# Patient Record
Sex: Female | Born: 2000 | Race: Black or African American | Hispanic: No | Marital: Single | Smoking: Never smoker
Health system: Southern US, Community
[De-identification: ages and names within clinical notes are randomized; demographics above are authoritative.]

---

## 2019-12-15 ENCOUNTER — Other Ambulatory Visit: Payer: Self-pay

## 2019-12-15 ENCOUNTER — Emergency Department (HOSPITAL_COMMUNITY)
Admission: EM | Admit: 2019-12-15 | Discharge: 2019-12-15 | Disposition: A | Discharge status: Home / Self Care | Payer: Medicaid Other | Attending: Emergency Medicine | Admitting: Emergency Medicine

## 2019-12-15 ENCOUNTER — Encounter (HOSPITAL_COMMUNITY): Payer: Self-pay | Admitting: Emergency Medicine

## 2019-12-15 DIAGNOSIS — K0889 Other specified disorders of teeth and supporting structures: Secondary | ICD-10-CM | POA: Diagnosis present

## 2019-12-15 MED ORDER — NAPROXEN 250 MG PO TABS
500.0000 mg | ORAL_TABLET | Freq: Once | ORAL | Status: AC
  Administered 2019-12-15: 500 mg via ORAL
  Filled 2019-12-15: qty 2

## 2019-12-15 MED ORDER — PENICILLIN V POTASSIUM 500 MG PO TABS: 500.0000 mg | ORAL_TABLET | Freq: Four times a day (QID) | ORAL | 0 refills | Status: AC

## 2019-12-15 MED ORDER — BENZOCAINE 20 % MT GEL: 1.0000 "application " | Freq: Four times a day (QID) | OROMUCOSAL | 0 refills | Status: DC | PRN

## 2019-12-15 MED ORDER — NAPROXEN 500 MG PO TABS
500.0000 mg | ORAL_TABLET | Freq: Two times a day (BID) | ORAL | 0 refills | Status: DC
Start: 2019-12-15 — End: 2020-10-08

## 2019-12-15 NOTE — ED Triage Notes (Signed)
C/o right side dental pain for several day seen by dentist with no treatment.

## 2019-12-15 NOTE — ED Notes (Signed)
Pt is having pain on the right side lower back tooth. Was seen at dentist and they did an xray. Last took tylenol around 1400 today.

## 2019-12-15 NOTE — Discharge Instructions (Addendum)
As discussed, I am sending you home with pain medication, an antibiotic, and numbing gel.  Use as prescribed.  Please follow-up with your dentist early this week for further evaluation.  Return to the ER for new or worsening symptoms.

## 2019-12-15 NOTE — ED Notes (Signed)
Patient verbalizes understanding of discharge instructions. Opportunity for questioning and answers were provided. Armband removed by staff, pt discharged from ED. Pt. ambulatory and discharged home.  

## 2019-12-15 NOTE — ED Provider Notes (Signed)
[facility] EMERGENCY DEPARTMENT Provider Note   CSN: [id number] Arrival date & time: 12/15/19  2024     History Chief Complaint  Patient presents with  . Dental Pain    Nancy Dunn is a 19 y.o. female with no significant past medical history who presents to the ED due to right-sided dental pain x4 days.  Patient was seen by the dentist on Friday in which an x-ray was performed which was negative for any acute abnormalities.  Patient has tried Tylenol, Advil, and Excedrin with moderate relief.  Patient notes dental pain is located on the right lower side and occasionally radiates to her right ear. Dental pain worse with chewing. Denies trismus, changes to phonation, difficulties breathing, fever, and chills. Admits to slight edema of her gum around the right posterior molar. Mother is at bedside and gave a portion of the history.  History obtained from patient, mother, and past medical records. No interpreter used during encounter.      History reviewed. No pertinent past medical history.  There are no problems to display for this patient.   History reviewed. No pertinent surgical history.   OB History   No obstetric history on file.     No family history on file.  Social History   Tobacco Use  . Smoking status: Never Smoker  . Smokeless tobacco: Never Used  Substance Use Topics  . Alcohol use: Never  . Drug use: Never    Home Medications Prior to Admission medications   Medication Sig Start Date End Date Taking? Authorizing Provider  benzocaine (HURRICAINE) 20 % GEL Use as directed 1 application in the mouth or throat 4 (four) times daily as needed. 12/15/19   Mannie Stabile, PA-C  naproxen (NAPROSYN) 500 MG tablet Take 1 tablet (500 mg total) by mouth 2 (two) times daily. 12/15/19   Mannie Stabile, PA-C  penicillin v potassium (VEETID) 500 MG tablet Take 1 tablet (500 mg total) by mouth 4 (four) times daily for 7 days. 12/15/19 12/22/19   Mannie Stabile, PA-C    Allergies    Patient has no known allergies.  Review of Systems   Review of Systems  Constitutional: Negative for chills and fever.  HENT: Positive for dental problem. Negative for facial swelling, trouble swallowing and voice change.     Physical Exam Updated Vital Signs BP (!) 138/99   Pulse 93   Temp 98.2 F (36.8 C) (Oral)   Resp 18   Ht 5\' 6"  (1.676 m)   Wt (!) 159.7 kg   SpO2 98%   BMI 56.81 kg/m   Physical Exam Vitals and nursing note reviewed.  Constitutional:      General: She is not in acute distress.    Appearance: She is not ill-appearing.  HENT:     Head: Normocephalic.     Right Ear: Tympanic membrane normal.     Left Ear: Tympanic membrane normal.     Mouth/Throat:     Comments: Good dentition throughout.  Tenderness to palpation over right lower posterior molar with mild gingivitis.  No abscess appreciated on exam.  No trismus.  Tongue in normal position without protrusion.  No tenderness below tongue. Eyes:     Pupils: Pupils are equal, round, and reactive to light.  Neck:     Comments: No meningismus. Cardiovascular:     Rate and Rhythm: Normal rate and regular rhythm.     Pulses: Normal pulses.     Heart sounds:  Normal heart sounds. No murmur. No friction rub. No gallop.   Pulmonary:     Effort: Pulmonary effort is normal.     Breath sounds: Normal breath sounds.  Abdominal:     General: Abdomen is flat. There is no distension.     Palpations: Abdomen is soft.     Tenderness: There is no abdominal tenderness. There is no guarding or rebound.  Musculoskeletal:     Cervical back: Neck supple.     Comments: Able to move all 4 extremities without difficulty.  Skin:    General: Skin is warm and dry.  Neurological:     General: No focal deficit present.     Mental Status: She is alert.     ED Results / Procedures / Treatments   Labs (all labs ordered are listed, but only abnormal results are displayed) Labs  Reviewed - No data to display  EKG None  Radiology No results found.  Procedures Procedures (including critical care time)  Medications Ordered in ED Medications  naproxen (NAPROSYN) tablet 500 mg (500 mg Oral Given 12/15/19 2111)    ED Course  I have reviewed the triage vital signs and the nursing notes.  Pertinent labs & imaging results that were available during my care of the patient were reviewed by me and considered in my medical decision making (see chart for details).  Clinical Course as of Dec 15 2131  Nancy Fetter Dec 15, 2019  2133 Pulse Rate: 93 [CA]    Clinical Course User Index [CA] Suzy Bouchard, PA-C   MDM Rules/Calculators/A&P                     19 year old female presents to the ED due to right lower dental pain x4 days.  Upon arrival, patient is afebrile with mild tachycardia at 112, but otherwise reassuring vitals.  Patient in no acute distress and non-ill-appearing.  Good dentition throughout.  Tenderness palpation over right lower posterior molar with mild gingivitis.  No abscess appreciated on exam.  No trismus.  No concern for Ludwigs or deep space infection. Patient given naproxen here in the ED. Will discharge patient with antibiotic, pain medication, and benzocaine for symptomatic relief.  Advised patient to call dentist tomorrow for further evaluation. Strict ED precautions discussed with patient. Patient states understanding and agrees to plan. Patient discharged home in no acute distress and stable vitals.  9:34 PM Reassessed patient at bedside to ensure normal HR. Patient's HR in 90s at bedside.   Final Clinical Impression(s) / ED Diagnoses Final diagnoses:  Pain, dental    Rx / DC Orders ED Discharge Orders         Ordered    naproxen (NAPROSYN) 500 MG tablet  2 times daily     12/15/19 2114    penicillin v potassium (VEETID) 500 MG tablet  4 times daily     12/15/19 2114    benzocaine (HURRICAINE) 20 % GEL  4 times daily PRN     12/15/19  2114           Karie Kirks 12/15/19 2134    Blanchie Dessert, MD 12/15/19 [phone]

## 2019-12-22 ENCOUNTER — Other Ambulatory Visit: Payer: Self-pay

## 2019-12-22 ENCOUNTER — Encounter (HOSPITAL_COMMUNITY): Payer: Self-pay | Admitting: *Deleted

## 2019-12-22 ENCOUNTER — Emergency Department (HOSPITAL_COMMUNITY)
Admission: EM | Admit: 2019-12-22 | Discharge: 2019-12-22 | Disposition: A | Discharge status: Home / Self Care | Payer: Medicaid Other | Attending: Emergency Medicine | Admitting: Emergency Medicine

## 2019-12-22 DIAGNOSIS — Z5321 Procedure and treatment not carried out due to patient leaving prior to being seen by health care provider: Secondary | ICD-10-CM | POA: Diagnosis not present

## 2019-12-22 DIAGNOSIS — N899 Noninflammatory disorder of vagina, unspecified: Secondary | ICD-10-CM | POA: Insufficient documentation

## 2019-12-22 LAB — CBC
HCT: 39.8 % (ref 36.0–46.0)
Hemoglobin: 12.3 g/dL (ref 12.0–15.0)
MCH: 27.9 pg (ref 26.0–34.0)
MCHC: 30.9 g/dL (ref 30.0–36.0)
MCV: 90.2 fL (ref 80.0–100.0)
Platelets: 449 10*3/uL — ABNORMAL HIGH (ref 150–400)
RBC: 4.41 MIL/uL (ref 3.87–5.11)
RDW: 13.9 % (ref 11.5–15.5)
WBC: 10.2 10*3/uL (ref 4.0–10.5)
nRBC: 0 % (ref 0.0–0.2)

## 2019-12-22 LAB — COMPREHENSIVE METABOLIC PANEL
ALT: 22 U/L (ref 0–44)
AST: 20 U/L (ref 15–41)
Albumin: 3.7 g/dL (ref 3.5–5.0)
Alkaline Phosphatase: 72 U/L (ref 38–126)
Anion gap: 9 (ref 5–15)
BUN: 9 mg/dL (ref 6–20)
CO2: 23 mmol/L (ref 22–32)
Calcium: 9.3 mg/dL (ref 8.9–10.3)
Chloride: 104 mmol/L (ref 98–111)
Creatinine, Ser: 0.81 mg/dL (ref 0.44–1.00)
GFR calc Af Amer: >60 mL/min (ref >60)
GFR calc non Af Amer: >60 mL/min (ref >60)
Glucose, Bld: 101 mg/dL — ABNORMAL HIGH (ref 70–99)
Potassium: 4.3 mmol/L (ref 3.5–5.1)
Sodium: 136 mmol/L (ref 135–145)
Total Bilirubin: 0.5 mg/dL (ref 0.3–1.2)
Total Protein: 7.6 g/dL (ref 6.5–8.1)

## 2019-12-22 LAB — URINALYSIS, ROUTINE W REFLEX MICROSCOPIC
Bilirubin Urine: NEGATIVE
Glucose, UA: NEGATIVE mg/dL
Hgb urine dipstick: NEGATIVE
Ketones, ur: NEGATIVE mg/dL
Nitrite: NEGATIVE
Protein, ur: NEGATIVE mg/dL
Specific Gravity, Urine: 1.02 (ref 1.005–1.030)
pH: 6 (ref 5.0–8.0)

## 2019-12-22 LAB — I-STAT BETA HCG BLOOD, ED (MC, WL, AP ONLY): I-stat hCG, quantitative: <5 m[IU]/mL (ref <5)

## 2019-12-22 LAB — LIPASE, BLOOD: Lipase: 23 U/L (ref 11–51)

## 2019-12-22 MED ORDER — SODIUM CHLORIDE 0.9% FLUSH: 3.0000 mL | Freq: Once | INTRAVENOUS | Status: DC

## 2019-12-22 NOTE — ED Triage Notes (Signed)
Pt c/o vaginal pain and swelling she just noticed earlier tonight she reorts that its not that bad but she decided to come tonight before it gets worse  lmp march 17-24

## 2019-12-22 NOTE — ED Notes (Signed)
Called x3 wit no response

## 2019-12-22 NOTE — ED Notes (Signed)
Called x1 for vitals recheck with no response 

## 2020-04-23 ENCOUNTER — Emergency Department (HOSPITAL_COMMUNITY)
Admission: EM | Admit: 2020-04-23 | Discharge: 2020-04-23 | Disposition: A | Discharge status: Home / Self Care | Payer: Medicaid Other | Attending: Emergency Medicine | Admitting: Emergency Medicine

## 2020-04-23 ENCOUNTER — Emergency Department (HOSPITAL_COMMUNITY): Payer: Medicaid Other

## 2020-04-23 ENCOUNTER — Other Ambulatory Visit: Payer: Self-pay

## 2020-04-23 ENCOUNTER — Encounter (HOSPITAL_COMMUNITY): Payer: Self-pay

## 2020-04-23 DIAGNOSIS — Z20822 Contact with and (suspected) exposure to covid-19: Secondary | ICD-10-CM | POA: Diagnosis not present

## 2020-04-23 DIAGNOSIS — R0602 Shortness of breath: Secondary | ICD-10-CM | POA: Diagnosis present

## 2020-04-23 DIAGNOSIS — R079 Chest pain, unspecified: Secondary | ICD-10-CM | POA: Diagnosis not present

## 2020-04-23 DIAGNOSIS — Z5321 Procedure and treatment not carried out due to patient leaving prior to being seen by health care provider: Secondary | ICD-10-CM | POA: Diagnosis not present

## 2020-04-23 DIAGNOSIS — J029 Acute pharyngitis, unspecified: Secondary | ICD-10-CM | POA: Diagnosis not present

## 2020-04-23 LAB — CBC
HCT: 46.3 % — ABNORMAL HIGH (ref 36.0–46.0)
Hemoglobin: 14.3 g/dL (ref 12.0–15.0)
MCH: 27.8 pg (ref 26.0–34.0)
MCHC: 30.9 g/dL (ref 30.0–36.0)
MCV: 90.1 fL (ref 80.0–100.0)
Platelets: 458 10*3/uL — ABNORMAL HIGH (ref 150–400)
RBC: 5.14 MIL/uL — ABNORMAL HIGH (ref 3.87–5.11)
RDW: 15.5 % (ref 11.5–15.5)
WBC: 20.5 10*3/uL — ABNORMAL HIGH (ref 4.0–10.5)
nRBC: 0 % (ref 0.0–0.2)

## 2020-04-23 LAB — BASIC METABOLIC PANEL
Anion gap: 9 (ref 5–15)
BUN: 9 mg/dL (ref 6–20)
CO2: 25 mmol/L (ref 22–32)
Calcium: 9.9 mg/dL (ref 8.9–10.3)
Chloride: 102 mmol/L (ref 98–111)
Creatinine, Ser: 0.88 mg/dL (ref 0.44–1.00)
GFR calc Af Amer: >60 mL/min (ref >60)
GFR calc non Af Amer: >60 mL/min (ref >60)
Glucose, Bld: 153 mg/dL — ABNORMAL HIGH (ref 70–99)
Potassium: 5.7 mmol/L — ABNORMAL HIGH (ref 3.5–5.1)
Sodium: 136 mmol/L (ref 135–145)

## 2020-04-23 LAB — SARS CORONAVIRUS 2 BY RT PCR (HOSPITAL ORDER, PERFORMED IN ~~LOC~~ HOSPITAL LAB): SARS Coronavirus 2: NEGATIVE

## 2020-04-23 LAB — I-STAT BETA HCG BLOOD, ED (MC, WL, AP ONLY): I-stat hCG, quantitative: <5 m[IU]/mL (ref <5)

## 2020-04-23 LAB — TROPONIN I (HIGH SENSITIVITY): Troponin I (High Sensitivity): 3 ng/L (ref <18)

## 2020-04-23 NOTE — ED Triage Notes (Signed)
Pt reports that since Sun she has had SOB, CP, sore throat, pt is unvaccinated

## 2020-10-08 ENCOUNTER — Ambulatory Visit (INDEPENDENT_AMBULATORY_CARE_PROVIDER_SITE_OTHER): Payer: Medicaid Other

## 2020-10-08 ENCOUNTER — Ambulatory Visit (HOSPITAL_COMMUNITY)
Admission: EM | Admit: 2020-10-08 | Discharge: 2020-10-08 | Disposition: A | Discharge status: Home / Self Care | Payer: Medicaid Other | Attending: Emergency Medicine | Admitting: Emergency Medicine

## 2020-10-08 ENCOUNTER — Encounter (HOSPITAL_COMMUNITY): Payer: Self-pay | Admitting: Emergency Medicine

## 2020-10-08 ENCOUNTER — Other Ambulatory Visit: Payer: Self-pay

## 2020-10-08 DIAGNOSIS — M79671 Pain in right foot: Secondary | ICD-10-CM

## 2020-10-08 DIAGNOSIS — R2242 Localized swelling, mass and lump, left lower limb: Secondary | ICD-10-CM

## 2020-10-08 DIAGNOSIS — M79672 Pain in left foot: Secondary | ICD-10-CM | POA: Diagnosis not present

## 2020-10-08 MED ORDER — CYCLOBENZAPRINE HCL 5 MG PO TABS
10.0000 mg | ORAL_TABLET | Freq: Every day | ORAL | 0 refills | Status: DC
Start: 2020-10-08 — End: 2020-10-08

## 2020-10-08 MED ORDER — IBUPROFEN 800 MG PO TABS: 800.0000 mg | ORAL_TABLET | Freq: Three times a day (TID) | ORAL | 0 refills | Status: AC

## 2020-10-08 MED ORDER — CYCLOBENZAPRINE HCL 5 MG PO TABS: 5.0000 mg | ORAL_TABLET | Freq: Every day | ORAL | 0 refills | Status: AC

## 2020-10-08 NOTE — Discharge Instructions (Addendum)
Take ibuprofen 800 mg three times a day with food to help with pain  Take 5 mg muscle relaxer at bedtime as needed  Can you heat or ice for 15 minute intervals as needed for additional comfort  Can return to urgent care if pain worsens, increased swelling, increased numbness and symptoms persist

## 2020-10-08 NOTE — ED Provider Notes (Signed)
[facility]    CSN: [id number] Arrival date & time: 10/08/20  1733      History   Chief Complaint Chief Complaint  Patient presents with  . Fall  . Foot Injury    left    HPI Nancy Dunn is a 20 y.o. female.   Patient presents with left foot pain 10/10 and swelling after falling down stairs this morning. Unsure of landing. Numbness in 2nd-5th toe. ROM intact but painful. Painful to bear weight. Denies prior injury. Has not attempted medication.   History reviewed. No pertinent past medical history.  There are no problems to display for this patient.   History reviewed. No pertinent surgical history.  OB History   No obstetric history on file.      Home Medications    Prior to Admission medications   Medication Sig Start Date End Date Taking? Authorizing Provider  benzocaine (HURRICAINE) 20 % GEL Use as directed 1 application in the mouth or throat 4 (four) times daily as needed. 12/15/19   Mannie Stabile, PA-C  naproxen (NAPROSYN) 500 MG tablet Take 1 tablet (500 mg total) by mouth 2 (two) times daily. 12/15/19   Mannie Stabile, PA-C    Family History Family History  Problem Relation Age of Onset  . Healthy Mother   . Diabetes Father   . Hypertension Father     Social History Social History   Tobacco Use  . Smoking status: Never Smoker  . Smokeless tobacco: Never Used  Vaping Use  . Vaping Use: Never used  Substance Use Topics  . Alcohol use: Not Currently  . Drug use: Never     Allergies   Patient has no known allergies.   Review of Systems Review of Systems  Constitutional: Negative.   Respiratory: Negative.   Cardiovascular: Negative.   Musculoskeletal: Negative.   Skin: Negative.   Neurological: Positive for numbness. Negative for dizziness, tremors, seizures, syncope, facial asymmetry, speech difficulty, weakness, light-headedness and headaches.     Physical Exam Triage Vital Signs ED Triage Vitals  Enc Vitals  Group     BP 10/08/20 1747 (!) 143/101     Pulse Rate 10/08/20 1747 (!) 122     Resp 10/08/20 1747 20     Temp 10/08/20 1747 97.8 F (36.6 C)     Temp Source 10/08/20 1747 Tympanic     SpO2 10/08/20 1747 100 %     Weight 10/08/20 1744 (!) 363 lb (164.7 kg)     Height 10/08/20 1744 5\' 6"  (1.676 m)     Head Circumference --      Peak Flow --      Pain Score 10/08/20 1744 10     Pain Loc --      Pain Edu? --      Excl. in GC? --    No data found.  Updated Vital Signs BP (!) 143/101 (BP Location: Right Arm)   Pulse (!) 122   Temp 97.8 F (36.6 C) (Tympanic)   Resp 20   Ht 5\' 6"  (1.676 m)   Wt (!) 363 lb (164.7 kg)   LMP 09/19/2020   SpO2 100%   BMI 58.59 kg/m   Visual Acuity Right Eye Distance:   Left Eye Distance:   Bilateral Distance:    Right Eye Near:   Left Eye Near:    Bilateral Near:     Physical Exam Constitutional:      Appearance: Normal appearance. She is obese.  HENT:     Head: Normocephalic.  Eyes:     Extraocular Movements: Extraocular movements intact.  Pulmonary:     Effort: Pulmonary effort is normal.  Musculoskeletal:     Cervical back: Normal range of motion.     Comments: Swelling extending over midfoot into toes, tenderness over midfoot, ROM intact but painful   Skin:    General: Skin is warm and dry.  Neurological:     Mental Status: She is alert and oriented to person, place, and time. Mental status is at baseline.  Psychiatric:        Mood and Affect: Mood normal.        Behavior: Behavior normal.        Thought Content: Thought content normal.        Judgment: Judgment normal.      UC Treatments / Results  Labs (all labs ordered are listed, but only abnormal results are displayed) Labs Reviewed - No data to display  EKG   Radiology No results found.  Procedures Procedures (including critical care time)  Medications Ordered in UC Medications - No data to display  Initial Impression / Assessment and Plan / UC Course   I have reviewed the triage vital signs and the nursing notes.  Pertinent labs & imaging results that were available during my care of the patient were reviewed by me and considered in my medical decision making (see chart for details).  Right foot pain  1. X-ray right foot- negative 2. Ibuprofen 800 mg tid  3. Flexeril 5 mg at bedtime as needed 4. Cold or heat in 15 minute intervals as needed 5. Follow precautions discussed with patient. Verbalized understanding  Final Clinical Impressions(s) / UC Diagnoses   Final diagnoses:  None   Discharge Instructions   None    ED Prescriptions    None     PDMP not reviewed this encounter.   Valinda Hoar, NP 10/08/20 [phone]

## 2020-10-08 NOTE — ED Triage Notes (Signed)
Pt presents today with c/o of left foot pain. She reports falling this a.m. causing pain/swelling. +Ambulatory.

## 2022-03-18 IMAGING — DX DG FOOT COMPLETE 3+V*L*
3 series · 3 of 3 positions shown · non-contrast
Comparison: None.

CLINICAL DATA: Pt presents today with c/o of left foot pain. She
reports falling this a.m. causing pain/swelling.

EXAM:
LEFT FOOT - COMPLETE 3+ VIEW

[foot ap]
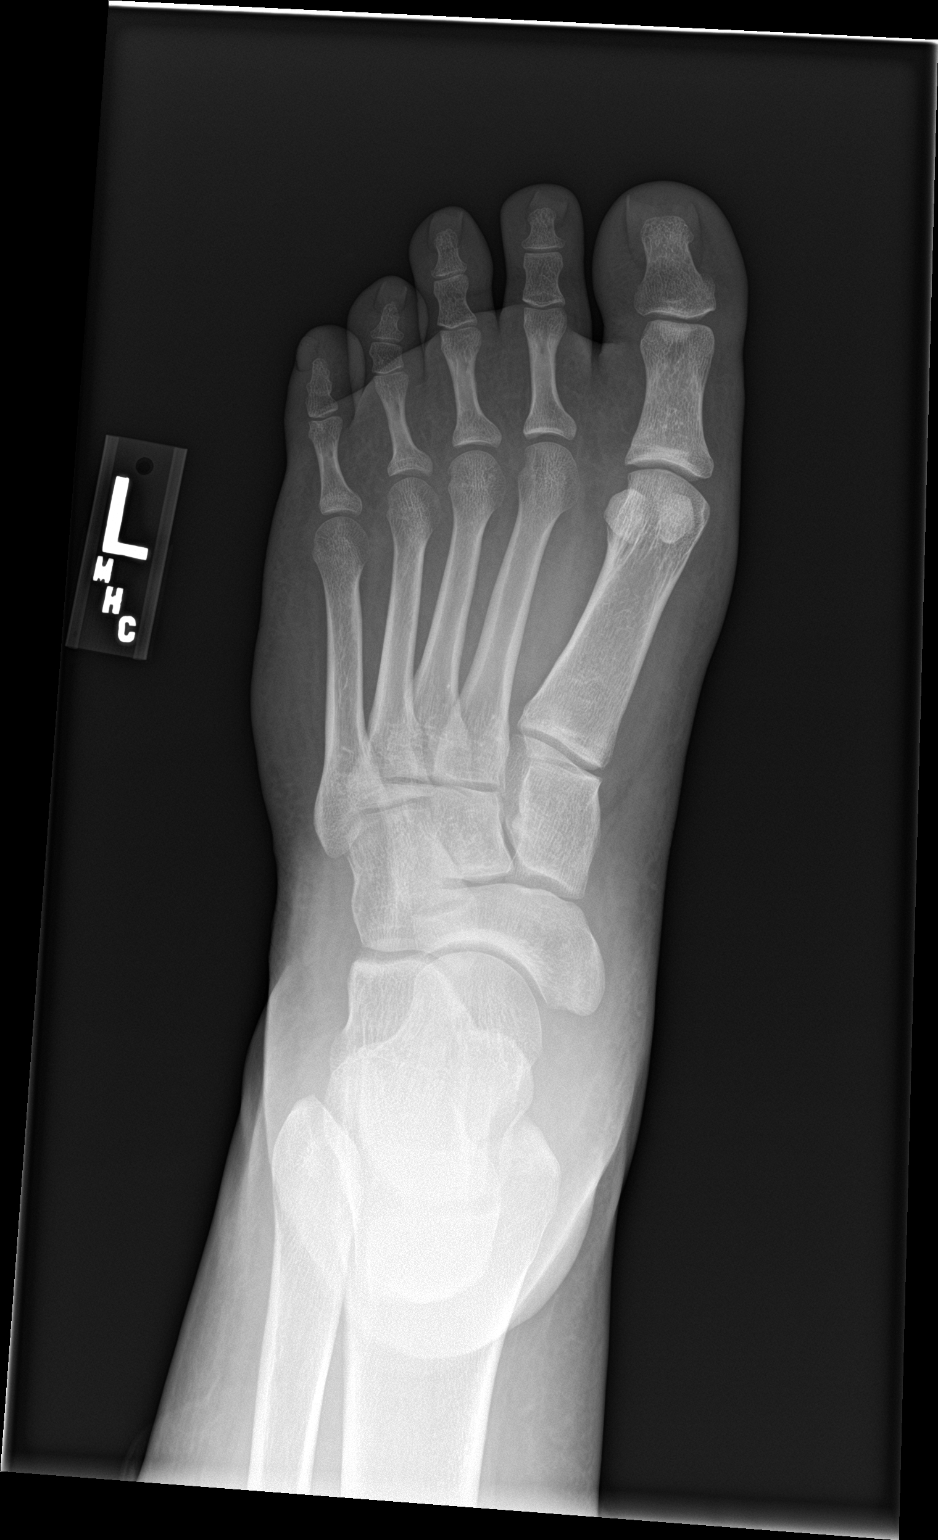

[foot obl]
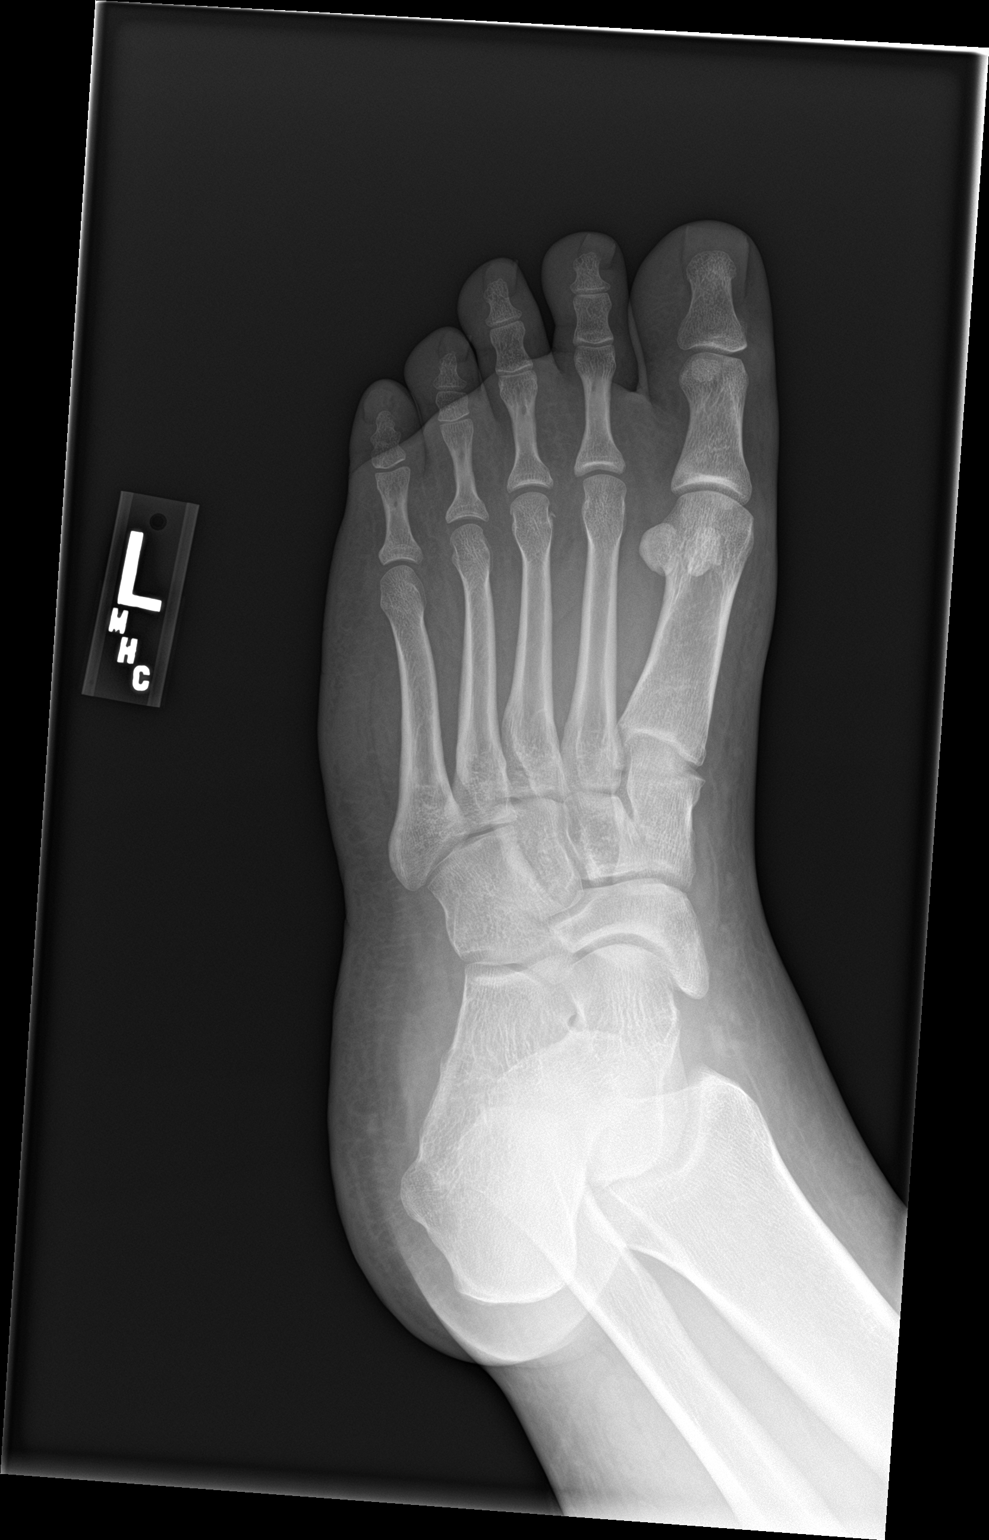

[foot lat]
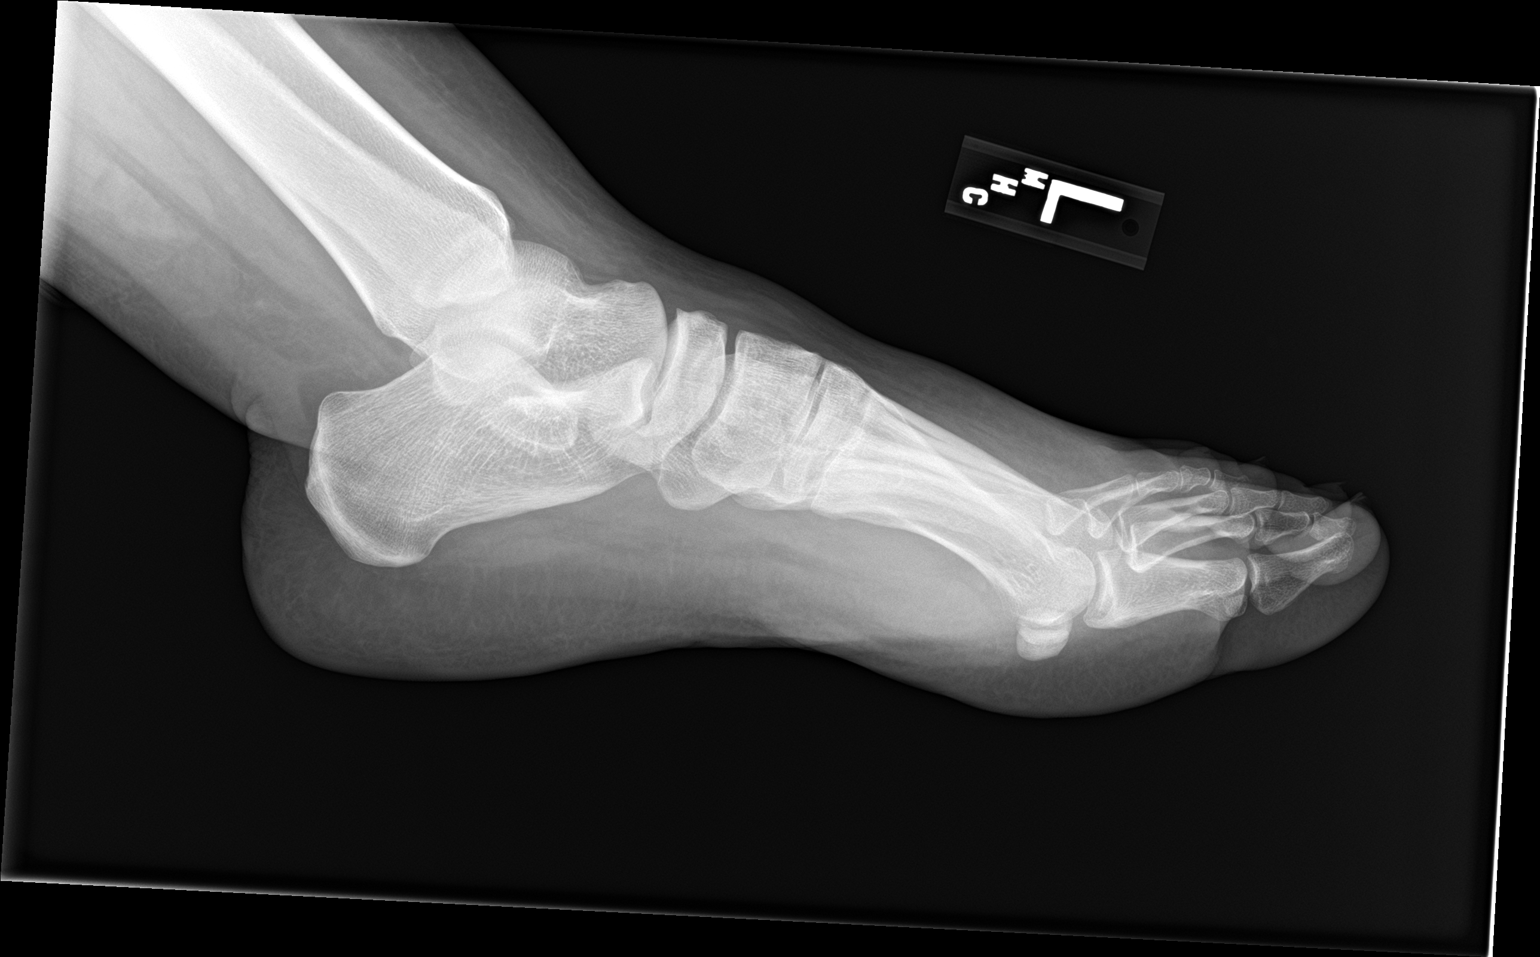

[3 of 3 positions shown; findings below may reference images not displayed]

FINDINGS: There is no evidence of fracture or dislocation. There is no
evidence of arthropathy or other focal bone abnormality. Soft
tissues are unremarkable.
IMPRESSION: Negative.
